# Patient Record
Sex: Female | Born: 2000
Health system: Southern US, Community
[De-identification: ages and names within clinical notes are randomized; demographics above are authoritative.]

## PROBLEM LIST (undated history)

## (undated) DIAGNOSIS — K449 Diaphragmatic hernia without obstruction or gangrene: Secondary | ICD-10-CM

## (undated) DIAGNOSIS — J45909 Unspecified asthma, uncomplicated: Secondary | ICD-10-CM

## (undated) DIAGNOSIS — K429 Umbilical hernia without obstruction or gangrene: Secondary | ICD-10-CM

## (undated) DIAGNOSIS — J939 Pneumothorax, unspecified: Secondary | ICD-10-CM

## (undated) HISTORY — PX: HERNIA REPAIR: SHX51

---
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
## 2023-09-29 ENCOUNTER — Encounter: Payer: Self-pay | Admitting: Pediatrics

## 2023-12-01 ENCOUNTER — Ambulatory Visit
Admission: EM | Admit: 2023-12-01 | Discharge: 2023-12-01 | Disposition: A | Discharge status: Home / Self Care | Payer: No Typology Code available for payment source

## 2023-12-01 DIAGNOSIS — R519 Headache, unspecified: Secondary | ICD-10-CM

## 2023-12-01 DIAGNOSIS — R42 Dizziness and giddiness: Secondary | ICD-10-CM

## 2023-12-01 DIAGNOSIS — R110 Nausea: Secondary | ICD-10-CM | POA: Diagnosis not present

## 2023-12-01 MED ORDER — MECLIZINE HCL 12.5 MG PO TABS: 12.5000 mg | ORAL_TABLET | Freq: Three times a day (TID) | ORAL | 0 refills | Status: AC | PRN

## 2023-12-01 NOTE — ED Provider Notes (Signed)
 Katherine Griffin    CSN: [id number] Arrival date & time: 12/01/23  0840      History   Chief Complaint Chief Complaint  Patient presents with   Nausea    HPI Katherine Griffin is a 23 y.o. female.  Patient presents with 87-month history of intermittent episodes of nausea, dizziness, headache.  She states the episodes come and waves when she is driving or changing position.  She describes the dizziness as the room spinning at times and her feeling off balance at times.  She has been seen by her PCP at the [facility] for these symptoms twice.  She was told that she has a vitamin D deficiency and started on vitamin D supplement.  Her symptoms have not improved.  She spoke to a nurse yesterday at the [facility] and was told to come to urgent care for evaluation of possible vertigo.  Patient denies weakness, numbness, chest pain, shortness of breath, vomiting with these episodes.  She currently has a mild headache which she rates as 3/10 but no nausea or dizziness.  Her medical history includes asthma, allergies, eczema, GERD, ADHD.  Patient was seen at [facility] ED on 09/27/2023 for chest pain and had negative workup.  LMP 11/24/2023.  The history is provided by the patient and medical records.    Past Medical History:  Diagnosis Date   Acne vulgaris 09/2016   ADHD 11/2018   Allergic rhinitis 01/2018   Asthma 08/2015   Diaphragmatic hernia newborn   Eczema 09/2016   Gastroesophageal reflux 04/2014   GI - Dr Gretta   Lactose intolerance 12/2013   GI - Dr Gretta   Pneumothorax newborn   Umbilical hernia 2008    There are no active problems to display for this patient.   Past Surgical History:  Procedure Laterality Date   DIAPHRAGMATIC HERNIA REPAIR Right 2002-08-23   UMBILICAL HERNIA REPAIR  03/2007    OB History   No obstetric history on file.      Home Medications    Prior to Admission medications   Medication Sig Start Date End Date Taking? Authorizing Provider   cholecalciferol (VITAMIN D3) 25 MCG (1000 UNIT) tablet Take 1,000 Units by mouth daily.   Yes [provider]  FLUoxetine (PROZAC) 20 MG tablet Take 20 mg by mouth daily.   Yes [provider]  meclizine  (ANTIVERT ) 12.5 MG tablet Take 1 tablet (12.5 mg total) by mouth 3 (three) times daily as needed for dizziness. 12/01/23  Yes Corlis Burnard DEL, NP  QUEtiapine (SEROQUEL) 50 MG tablet Take 50 mg by mouth at bedtime.   Yes [provider]  albuterol (PROVENTIL HFA;VENTOLIN HFA) 108 (90 BASE) MCG/ACT inhaler Inhale 2 puffs into the lungs every 4 (four) hours as needed for wheezing or shortness of breath.    [provider]  fluticasone  (FLONASE ) 50 MCG/ACT nasal spray Place 1 spray into both nostrils daily. 08/29/19 09/28/19  Qayumi, Zainab S, MD  loratadine (CLARITIN) 10 MG tablet Take 10 mg by mouth daily.    [provider]  methylphenidate  (CONCERTA ) 18 MG PO CR tablet Take 1 tablet (18 mg total) by mouth every morning. Patient not taking: Reported on 12/01/2023 08/29/19 09/28/19  Qayumi, Zainab S, MD  Multiple Vitamins-Minerals (MULTIVITAMIN PO) Take 2 tablets by mouth daily.    [provider]    Family History History reviewed. No pertinent family history.  Social History Social History   Tobacco Use   Smoking status: Never  Smokeless tobacco: Never  Substance Use Topics   Alcohol use: Never   Drug use: Never     Allergies   Lactose, Lactose intolerance (gi), and Penicillins   Review of Systems Review of Systems  Constitutional:  Negative for chills and fever.  Respiratory:  Negative for cough and shortness of breath.   Cardiovascular:  Negative for chest pain and palpitations.  Gastrointestinal:  Positive for nausea. Negative for abdominal pain and vomiting.  Neurological:  Positive for dizziness, light-headedness and headaches. Negative for syncope, facial asymmetry, speech difficulty, weakness and numbness.     Physical  Exam Triage Vital Signs ED Triage Vitals  Encounter Vitals Group     BP 12/01/23 0935 119/76     Systolic BP Percentile --      Diastolic BP Percentile --      Pulse Rate 12/01/23 0935 74     Resp 12/01/23 0935 18     Temp 12/01/23 0935 98.2 F (36.8 C)     Temp src --      SpO2 12/01/23 0935 99 %     Weight --      Height --      Head Circumference --      Peak Flow --      Pain Score 12/01/23 0938 0     Pain Loc --      Pain Education --      Exclude from Growth Chart --    No data found.  Updated Vital Signs BP 119/76   Pulse 74   Temp 98.2 F (36.8 C)   Resp 18   LMP 11/24/2023   SpO2 99%   Visual Acuity Right Eye Distance:   Left Eye Distance:   Bilateral Distance:    Right Eye Near:   Left Eye Near:    Bilateral Near:     Physical Exam Constitutional:      General: She is not in acute distress. HENT:     Right Ear: Tympanic membrane normal.     Left Ear: Tympanic membrane normal.     Nose: Nose normal.     Mouth/Throat:     Mouth: Mucous membranes are moist.     Pharynx: Oropharynx is clear.  Eyes:     Pupils: Pupils are equal, round, and reactive to light.  Cardiovascular:     Rate and Rhythm: Normal rate and regular rhythm.     Heart sounds: Normal heart sounds.  Pulmonary:     Effort: Pulmonary effort is normal. No respiratory distress.     Breath sounds: Normal breath sounds.  Abdominal:     General: Bowel sounds are normal.     Palpations: Abdomen is soft.     Tenderness: There is no abdominal tenderness. There is no guarding or rebound.  Skin:    General: Skin is warm and dry.  Neurological:     General: No focal deficit present.     Mental Status: She is alert and oriented to person, place, and time.     Cranial Nerves: No cranial nerve deficit.     Sensory: No sensory deficit.     Motor: No weakness.     Coordination: Romberg sign negative.     Gait: Gait normal.      UC Treatments / Results  Labs (all labs ordered are  listed, but only abnormal results are displayed) Labs Reviewed - No data to display  EKG   Radiology No results found.  Procedures Procedures (including critical  care time)  Medications Ordered in UC Medications - No data to display  Initial Impression / Assessment and Plan / UC Course  I have reviewed the triage vital signs and the nursing notes.  Pertinent labs & imaging results that were available during my care of the patient were reviewed by me and considered in my medical decision making (see chart for details).    Dizziness, vertigo, nausea without vomiting, headache.  Afebrile and vital signs are stable.  Exam is reassuring.  Treating today with meclizine ; precautions for drowsiness with this medication discussed.  Instructed patient to follow-up with her PCP tomorrow.  ED precautions given.  Education provided on vertigo, dizziness, nausea, headache.  Patient agrees to plan of care.  Final Clinical Impressions(s) / UC Diagnoses   Final diagnoses:  Vertigo  Dizziness  Nausea without vomiting  Nonintractable episodic headache, unspecified headache type     Discharge Instructions      Take the meclizine  as directed.  Do not drive, operate machinery, drink alcohol, or perform dangerous activities while taking this medication as it may cause drowsiness.  Follow up with your primary care provider tomorrow.  Go to the emergency department if you have worsening symptoms.        ED Prescriptions     Medication Sig Dispense Auth. Provider   meclizine  (ANTIVERT ) 12.5 MG tablet Take 1 tablet (12.5 mg total) by mouth 3 (three) times daily as needed for dizziness. 30 tablet Corlis Burnard DEL, NP      PDMP not reviewed this encounter.   Corlis Burnard DEL, NP 12/01/23 1044

## 2023-12-01 NOTE — ED Triage Notes (Addendum)
 Patient to Urgent Care with complaints of headaches/ nausea.   Symptoms started 2-3 months ago. Has been seen at the [facility] twice for the same. Told she has a vitamin D deficiency- spoke with a triage nurse yesterday who advised her to be seen at Urgent Care for possible vertigo.   Taking vitamin D supplement w/ little improvement.

## 2023-12-01 NOTE — Discharge Instructions (Addendum)
 Take the meclizine  as directed.  Do not drive, operate machinery, drink alcohol, or perform dangerous activities while taking this medication as it may cause drowsiness.  Follow up with your primary care provider tomorrow.  Go to the emergency department if you have worsening symptoms.

## 2024-01-03 ENCOUNTER — Encounter (HOSPITAL_BASED_OUTPATIENT_CLINIC_OR_DEPARTMENT_OTHER): Payer: Self-pay | Admitting: Emergency Medicine

## 2024-01-03 ENCOUNTER — Emergency Department (HOSPITAL_BASED_OUTPATIENT_CLINIC_OR_DEPARTMENT_OTHER)
Admission: EM | Admit: 2024-01-03 | Discharge: 2024-01-03 | Disposition: A | Discharge status: Home / Self Care | Payer: Non-veteran care | Attending: Emergency Medicine | Admitting: Emergency Medicine

## 2024-01-03 ENCOUNTER — Other Ambulatory Visit: Payer: Self-pay

## 2024-01-03 DIAGNOSIS — Z20822 Contact with and (suspected) exposure to covid-19: Secondary | ICD-10-CM | POA: Insufficient documentation

## 2024-01-03 DIAGNOSIS — J029 Acute pharyngitis, unspecified: Secondary | ICD-10-CM | POA: Diagnosis present

## 2024-01-03 DIAGNOSIS — J101 Influenza due to other identified influenza virus with other respiratory manifestations: Secondary | ICD-10-CM | POA: Diagnosis not present

## 2024-01-03 LAB — RESP PANEL BY RT-PCR (RSV, FLU A&B, COVID)  RVPGX2
Influenza A by PCR: POSITIVE — AB
Influenza B by PCR: NEGATIVE
Resp Syncytial Virus by PCR: NEGATIVE
SARS Coronavirus 2 by RT PCR: NEGATIVE

## 2024-01-03 MED ORDER — KETOROLAC TROMETHAMINE 15 MG/ML IJ SOLN
30.0000 mg | Freq: Once | INTRAMUSCULAR | Status: AC
  Administered 2024-01-03: 30 mg via INTRAMUSCULAR
  Filled 2024-01-03: qty 2

## 2024-01-03 MED ORDER — OXYMETAZOLINE HCL 0.05 % NA SOLN: 1.0000 | Freq: Two times a day (BID) | NASAL | 0 refills | Status: AC

## 2024-01-03 NOTE — ED Provider Notes (Signed)
  EMERGENCY DEPARTMENT AT [facility] Provider Note   CSN: [id number] Arrival date & time: 01/03/24  1333     History  Chief Complaint  Patient presents with   Influenza    Katherine Griffin is a 23 y.o. female.  Patient with no significant past medical history presents to the emergency department for evaluation of flulike symptoms over the past 6 days.  Patient originally had a sore throat, cough and fever.  This lasted for about 2 to 3 days.  She also had headaches which have improved.  She continues to have nasal congestion and she cannot breathe well through her nose.  She also has generalized bodyaches which have been persistent.  She states her appetite has been poor because she cannot smell.  She has tried over-the-counter medications without much improvement.       Home Medications Prior to Admission medications   Medication Sig Start Date End Date Taking? Authorizing Provider  oxymetazoline  (AFRIN NASAL SPRAY) 0.05 % nasal spray Place 1 spray into both nostrils 2 (two) times daily. Do not use for more than 3 consecutive days as this medication will cause rebound congestion. 01/03/24  Yes Desiderio Chew, PA-C  albuterol  (PROVENTIL  HFA;VENTOLIN  HFA) 108 (90 BASE) MCG/ACT inhaler Inhale 2 puffs into the lungs every 4 (four) hours as needed for wheezing or shortness of breath.    [provider]  cholecalciferol (VITAMIN D3) 25 MCG (1000 UNIT) tablet Take 1,000 Units by mouth daily.    [provider]  FLUoxetine (PROZAC) 20 MG tablet Take 20 mg by mouth daily.    [provider]  fluticasone  (FLONASE ) 50 MCG/ACT nasal spray Place 1 spray into both nostrils daily. 08/29/19 09/28/19  Qayumi, Zainab S, MD  loratadine (CLARITIN) 10 MG tablet Take 10 mg by mouth daily.    [provider]  meclizine  (ANTIVERT ) 12.5 MG tablet Take 1 tablet (12.5 mg total) by mouth 3 (three) times daily as needed for dizziness. 12/01/23   Corlis Burnard DEL,  NP  methylphenidate  (CONCERTA ) 18 MG PO CR tablet Take 1 tablet (18 mg total) by mouth every morning. Patient not taking: Reported on 12/01/2023 08/29/19 09/28/19  Qayumi, Zainab S, MD  Multiple Vitamins-Minerals (MULTIVITAMIN PO) Take 2 tablets by mouth daily.    [provider]  QUEtiapine (SEROQUEL) 50 MG tablet Take 50 mg by mouth at bedtime.    [provider]      Allergies    Lactose, Lactose intolerance (gi), and Penicillins    Review of Systems   Review of Systems  Physical Exam Updated Vital Signs BP 127/87   Pulse 84   Temp 99 F (37.2 C) (Oral)   Resp 18   Ht 5' 3 (1.6 m)   Wt 65.8 kg   LMP 12/24/2023 (Approximate)   SpO2 100%   BMI 25.69 kg/m  Physical Exam Vitals and nursing note reviewed.  Constitutional:      Appearance: She is well-developed.  HENT:     Head: Normocephalic and atraumatic.     Jaw: No trismus.     Right Ear: Tympanic membrane, ear canal and external ear normal.     Left Ear: Tympanic membrane, ear canal and external ear normal.     Nose: Mucosal edema and congestion present. No rhinorrhea.     Right Turbinates: Swollen.     Left Turbinates: Swollen.     Mouth/Throat:     Mouth: Mucous membranes are moist. Mucous membranes are not dry.  No oral lesions.     Pharynx: Uvula midline. No oropharyngeal exudate, posterior oropharyngeal erythema or uvula swelling.     Tonsils: No tonsillar abscesses.  Eyes:     General:        Right eye: No discharge.        Left eye: No discharge.     Conjunctiva/sclera: Conjunctivae normal.  Cardiovascular:     Rate and Rhythm: Normal rate and regular rhythm.     Heart sounds: Normal heart sounds.  Pulmonary:     Effort: Pulmonary effort is normal. No respiratory distress.     Breath sounds: Normal breath sounds. No wheezing or rales.  Abdominal:     Palpations: Abdomen is soft.     Tenderness: There is no abdominal tenderness.  Musculoskeletal:     Cervical back: Normal range of  motion and neck supple.  Lymphadenopathy:     Cervical: No cervical adenopathy.  Skin:    General: Skin is warm and dry.  Neurological:     Mental Status: She is alert.  Psychiatric:        Mood and Affect: Mood normal.     ED Results / Procedures / Treatments   Labs (all labs ordered are listed, but only abnormal results are displayed) Labs Reviewed  RESP PANEL BY RT-PCR (RSV, FLU A&B, COVID)  RVPGX2 - Abnormal; Notable for the following components:      Result Value   Influenza A by PCR POSITIVE (*)    All other components within normal limits    EKG None  Radiology No results found.  Procedures Procedures    Medications Ordered in ED Medications  ketorolac  (TORADOL ) 15 MG/ML injection 30 mg (has no administration in time range)    ED Course/ Medical Decision Making/ A&P    Patient seen and examined. History obtained directly from patient. Work-up including labs, imaging, EKG ordered in triage, if performed, were reviewed.    Labs/EKG: Independently reviewed and interpreted.  This included: Viral panel, positive for influenza  Imaging: None ordered  Medications/Fluids: Ordered: IM Toradol  30 mg x 1   Most recent vital signs reviewed and are as follows: BP 127/87   Pulse 84   Temp 99 F (37.2 C) (Oral)   Resp 18   Ht 5' 3 (1.6 m)   Wt 65.8 kg   LMP 12/24/2023 (Approximate)   SpO2 100%   BMI 25.69 kg/m   Initial impression: Influenza A  Plan: Discharge to home.   Prescriptions written for: Oxymetazoline  x 3 days, we discussed appropriate use of this medication and the risk of rebound congestion if taken for more than 3 days.  Also encouraged antihistamine such as loratadine and ibuprofen  600 mg 3-4 times a day with food for body aches.  Other home care instructions discussed: Rest, hydration. Encouraged to rest and drink plenty of fluids. Patient told to return to ED or see their primary doctor if their symptoms worsen, high fever not controlled  with tylenol , persistent vomiting, they feel they are dehydrated, or if they have any other concerns.  Patient verbalized understanding and agreed with plan.                                  Medical Decision Making Risk OTC drugs. Prescription drug management.   Patient with symptoms consistent with influenza, gradually improving but still with lingering symptoms.  Treatment focusing on most bothersome symptoms at  this time.  Vitals are stable, no fever here. No signs of dehydration, tolerating PO's. Lungs are clear. Supportive therapy indicated with return if symptoms worsen. Patient counseled.         Final Clinical Impression(s) / ED Diagnoses Final diagnoses:  Influenza A    Rx / DC Orders ED Discharge Orders          Ordered    oxymetazoline  (AFRIN NASAL SPRAY) 0.05 % nasal spray  2 times daily        01/03/24 1822              Desiderio Chew, PA-C 01/03/24 1835    Jerrol Agent, MD 01/04/24 1450

## 2024-01-03 NOTE — Discharge Instructions (Signed)
 Please read and follow all provided instructions.  Your diagnoses today include:  1. Influenza A     Tests performed today include: Flu, COVID, RSV testing: Positive for influenza Vital signs. See below for your results today.   Medications prescribed:  Oxymetazoline  - nasal spray for congestion. Do not use for more than 3 days because this medicine can cause rebound congestion.   Take any prescribed medications only as directed.  Home care instructions:  Please take medication such as loratadine daily and use ibuprofen  600 mg every 6-8 hours with food for body aches.  Follow any educational materials contained in this packet. Please continue drinking plenty of fluids. Use over-the-counter cold and flu medications as needed as directed on packaging for symptom relief. You may also use ibuprofen  or tylenol  as directed on packaging for pain or fever.   BE VERY CAREFUL not to take multiple medicines containing Tylenol  (also called acetaminophen ). Doing so can lead to an overdose which can damage your liver and cause liver failure and possibly death.   Follow-up instructions: Please follow-up with your primary care provider in the next 3 days for further evaluation of your symptoms if not improving.  Return instructions:  Please return to the Emergency Department if you experience worsening symptoms. Please return if you have a high fever greater than 101 degrees not controlled with over-the-counter medications, persistent vomiting and cannot keep down fluids, or worsening trouble breathing. Please return if you have any other emergent concerns.  Additional Information:  Your vital signs today were: BP 127/87   Pulse 84   Temp 99 F (37.2 C) (Oral)   Resp 18   Ht 5' 3 (1.6 m)   Wt 65.8 kg   LMP 12/24/2023 (Approximate)   SpO2 100%   BMI 25.69 kg/m  If your blood pressure (BP) was elevated above 135/85 this visit, please have this repeated by your doctor within one month.

## 2024-01-03 NOTE — ED Triage Notes (Signed)
Pt via POV c/o flu symptoms and sinus headache x 5 days. Runny nose, cough, sore throat, body aches, fever.

## 2024-10-26 ENCOUNTER — Ambulatory Visit
Admission: EM | Admit: 2024-10-26 | Discharge: 2024-10-26 | Disposition: A | Discharge status: Home / Self Care | Attending: Family Medicine | Admitting: Family Medicine

## 2024-10-26 DIAGNOSIS — J069 Acute upper respiratory infection, unspecified: Secondary | ICD-10-CM

## 2024-10-26 DIAGNOSIS — J4521 Mild intermittent asthma with (acute) exacerbation: Secondary | ICD-10-CM | POA: Diagnosis not present

## 2024-10-26 LAB — POC COVID19/FLU A&B COMBO
Covid Antigen, POC: NEGATIVE
Influenza A Antigen, POC: NEGATIVE
Influenza B Antigen, POC: NEGATIVE

## 2024-10-26 MED ORDER — ALBUTEROL SULFATE HFA 108 (90 BASE) MCG/ACT IN AERS: 2.0000 | INHALATION_SPRAY | RESPIRATORY_TRACT | 0 refills | Status: AC | PRN

## 2024-10-26 MED ORDER — PREDNISONE 20 MG PO TABS: 40.0000 mg | ORAL_TABLET | Freq: Every day | ORAL | 0 refills | Status: AC

## 2024-10-26 MED ORDER — AZELASTINE HCL 0.1 % NA SOLN: 1.0000 | Freq: Two times a day (BID) | NASAL | 0 refills | Status: AC

## 2024-10-26 NOTE — ED Provider Notes (Signed)
 [facility]    CSN: [id number] Arrival date & time: 10/26/24  0841      History   Chief Complaint No chief complaint on file.   HPI Katherine Griffin is a 23 y.o. female.   Presenting today with 3-day history of nasal congestion, sinus pain and pressure, body aches, loss of taste and smell, fatigue.  Denies fever, chest pain, shortness of breath, abdominal pain, vomiting, diarrhea.  So far trying Sudafed, Afrin, DayQuil with minimal relief.  History of allergies and asthma, not currently on any inhaler regimen.    Past Medical History:  Diagnosis Date   Acne vulgaris 09/2016   ADHD 11/2018   Allergic rhinitis 01/2018   Asthma 08/2015   Diaphragmatic hernia newborn   Eczema 09/2016   Gastroesophageal reflux 04/2014   GI - Dr Gretta   Lactose intolerance 12/2013   GI - Dr Gretta   Pneumothorax newborn   Umbilical hernia 2008    There are no active problems to display for this patient.   Past Surgical History:  Procedure Laterality Date   DIAPHRAGMATIC HERNIA REPAIR Right 15-Nov-2002   UMBILICAL HERNIA REPAIR  03/2007    OB History   No obstetric history on file.      Home Medications    Prior to Admission medications   Medication Sig Start Date End Date Taking? Authorizing Provider  albuterol (VENTOLIN HFA) 108 (90 Base) MCG/ACT inhaler Inhale 2 puffs into the lungs every 4 (four) hours as needed. 10/26/24  Yes Stuart Vernell Norris, PA-C  azelastine (ASTELIN) 0.1 % nasal spray Place 1 spray into both nostrils 2 (two) times daily. Use in each nostril as directed 10/26/24  Yes Stuart Vernell Norris, PA-C  predniSONE (DELTASONE) 20 MG tablet Take 2 tablets (40 mg total) by mouth daily with breakfast. 10/26/24  Yes Stuart Vernell Norris, PA-C  albuterol (PROVENTIL HFA;VENTOLIN HFA) 108 (90 BASE) MCG/ACT inhaler Inhale 2 puffs into the lungs every 4 (four) hours as needed for wheezing or shortness of breath.    [provider]  cholecalciferol  (VITAMIN D3) 25 MCG (1000 UNIT) tablet Take 1,000 Units by mouth daily.    [provider]  FLUoxetine (PROZAC) 20 MG tablet Take 20 mg by mouth daily.    [provider]  fluticasone  (FLONASE ) 50 MCG/ACT nasal spray Place 1 spray into both nostrils daily. 08/29/19 09/28/19  Qayumi, Zainab S, MD  loratadine (CLARITIN) 10 MG tablet Take 10 mg by mouth daily.    [provider]  meclizine  (ANTIVERT ) 12.5 MG tablet Take 1 tablet (12.5 mg total) by mouth 3 (three) times daily as needed for dizziness. 12/01/23   Corlis Burnard DEL, NP  methylphenidate  (CONCERTA ) 18 MG PO CR tablet Take 1 tablet (18 mg total) by mouth every morning. Patient not taking: Reported on 12/01/2023 08/29/19 09/28/19  Qayumi, Zainab S, MD  Multiple Vitamins-Minerals (MULTIVITAMIN PO) Take 2 tablets by mouth daily.    [provider]  oxymetazoline  (AFRIN NASAL SPRAY) 0.05 % nasal spray Place 1 spray into both nostrils 2 (two) times daily. Do not use for more than 3 consecutive days as this medication will cause rebound congestion. 01/03/24   Geiple, Joshua, PA-C  QUEtiapine (SEROQUEL) 50 MG tablet Take 50 mg by mouth at bedtime.    [provider]    Family History History reviewed. No pertinent family history.  Social History Social History   Tobacco Use   Smoking status: Never   Smokeless tobacco: Never  Substance Use Topics   Alcohol use: Never   Drug use: Never     Allergies   Lactose, Lactose intolerance (gi), and Penicillins   Review of Systems Review of Systems Per HPI  Physical Exam Triage Vital Signs ED Triage Vitals  Encounter Vitals Group     BP 10/26/24 0911 137/88     Girls Systolic BP Percentile --      Girls Diastolic BP Percentile --      Boys Systolic BP Percentile --      Boys Diastolic BP Percentile --      Pulse Rate 10/26/24 0911 98     Resp 10/26/24 0911 20     Temp 10/26/24 0911 98.4 F (36.9 C)     Temp src --      SpO2 10/26/24 0911 100 %      Weight --      Height --      Head Circumference --      Peak Flow --      Pain Score 10/26/24 0912 4     Pain Loc --      Pain Education --      Exclude from Growth Chart --    No data found.  Updated Vital Signs BP 137/88 (BP Location: Right Arm)   Pulse 98   Temp 98.4 F (36.9 C)   Resp 20   LMP 10/16/2024 Comment: end date  SpO2 100%   Visual Acuity Right Eye Distance:   Left Eye Distance:   Bilateral Distance:    Right Eye Near:   Left Eye Near:    Bilateral Near:     Physical Exam Vitals and nursing note reviewed.  Constitutional:      Appearance: Normal appearance.  HENT:     Head: Atraumatic.     Right Ear: Tympanic membrane and external ear normal.     Left Ear: Tympanic membrane and external ear normal.     Nose: Rhinorrhea present.     Mouth/Throat:     Mouth: Mucous membranes are moist.     Pharynx: Posterior oropharyngeal erythema present.  Eyes:     Extraocular Movements: Extraocular movements intact.     Conjunctiva/sclera: Conjunctivae normal.  Cardiovascular:     Rate and Rhythm: Normal rate and regular rhythm.     Heart sounds: Normal heart sounds.  Pulmonary:     Effort: Pulmonary effort is normal.     Breath sounds: Normal breath sounds. No wheezing.  Musculoskeletal:        General: Normal range of motion.     Cervical back: Normal range of motion and neck supple.  Skin:    General: Skin is warm and dry.  Neurological:     Mental Status: She is alert and oriented to person, place, and time.  Psychiatric:        Mood and Affect: Mood normal.        Thought Content: Thought content normal.      UC Treatments / Results  Labs (all labs ordered are listed, but only abnormal results are displayed) Labs Reviewed  POC COVID19/FLU A&B COMBO    EKG   Radiology No results found.  Procedures Procedures (including critical care time)  Medications Ordered in UC Medications - No data to display  Initial Impression / Assessment  and Plan / UC Course  I have reviewed the triage vital signs and the nursing notes.  Pertinent labs & imaging results that were available during my care  of the patient were reviewed by me and considered in my medical decision making (see chart for details).     Rapid flu and COVID-negative, vitals and exam reassuring today.  Suspect viral respiratory infection causing asthma exacerbation.  Treat with prednisone, albuterol, Astelin, supportive over-the-counter medications and home care.  Return for worsening or unresolving symptoms.  Final Clinical Impressions(s) / UC Diagnoses   Final diagnoses:  Viral URI with cough  Mild intermittent asthma with acute exacerbation   Discharge Instructions   None    ED Prescriptions     Medication Sig Dispense Auth. Provider   predniSONE (DELTASONE) 20 MG tablet Take 2 tablets (40 mg total) by mouth daily with breakfast. 10 tablet Stuart Vernell Norris, PA-C   albuterol (VENTOLIN HFA) 108 (90 Base) MCG/ACT inhaler Inhale 2 puffs into the lungs every 4 (four) hours as needed. 18 g Stuart Vernell Norris, PA-C   azelastine (ASTELIN) 0.1 % nasal spray Place 1 spray into both nostrils 2 (two) times daily. Use in each nostril as directed 30 mL Stuart Vernell Norris, PA-C      PDMP not reviewed this encounter.   Stuart Vernell Norris, [place] 10/26/24 1922

## 2024-10-26 NOTE — ED Triage Notes (Signed)
 Pt reports she has a loss of taste and smell, body aches, sinus pressure, cough x 3 days   Took sudafed, afrin, and dayquil

## 2024-11-07 ENCOUNTER — Emergency Department (HOSPITAL_COMMUNITY)
Admission: EM | Admit: 2024-11-07 | Discharge: 2024-11-07 | Disposition: A | Discharge status: Home / Self Care | Attending: Emergency Medicine | Admitting: Emergency Medicine

## 2024-11-07 ENCOUNTER — Other Ambulatory Visit: Payer: Self-pay

## 2024-11-07 ENCOUNTER — Emergency Department (HOSPITAL_COMMUNITY)

## 2024-11-07 DIAGNOSIS — M79672 Pain in left foot: Secondary | ICD-10-CM | POA: Diagnosis present

## 2024-11-07 MED ORDER — IBUPROFEN 400 MG PO TABS: 600.0000 mg | ORAL_TABLET | Freq: Once | ORAL | Status: DC

## 2024-11-07 NOTE — ED Notes (Signed)
 Pt left before getting medications and discharge paperwork.

## 2024-11-07 NOTE — ED Provider Notes (Signed)
 [place] [facility] AT [facility] Provider Note   CSN: [id number] Arrival date & time: 11/07/24  0114     Patient presents with: Foot Pain  HPI Katherine Griffin is a 23 y.o. female presenting for left foot pain.  Started a couple days ago after she stepped on a toothpick.  She was able to dislodge the toothpick but unsure if there is something left still in there.  She is endorsing pain about the pad of her right foot.  No oozing or bleeding at this time.  No notable swelling or redness.    Foot Pain       Prior to Admission medications   Medication Sig Start Date End Date Taking? Authorizing Provider  albuterol  (PROVENTIL  HFA;VENTOLIN  HFA) 108 (90 BASE) MCG/ACT inhaler Inhale 2 puffs into the lungs every 4 (four) hours as needed for wheezing or shortness of breath.    [provider]  albuterol  (VENTOLIN  HFA) 108 (90 Base) MCG/ACT inhaler Inhale 2 puffs into the lungs every 4 (four) hours as needed. 10/26/24   Stuart Vernell Norris, PA-C  azelastine  (ASTELIN ) 0.1 % nasal spray Place 1 spray into both nostrils 2 (two) times daily. Use in each nostril as directed 10/26/24   Stuart Vernell Norris, PA-C  cholecalciferol (VITAMIN D3) 25 MCG (1000 UNIT) tablet Take 1,000 Units by mouth daily.    [provider]  FLUoxetine (PROZAC) 20 MG tablet Take 20 mg by mouth daily.    [provider]  fluticasone  (FLONASE ) 50 MCG/ACT nasal spray Place 1 spray into both nostrils daily. 08/29/19 09/28/19  Qayumi, Zainab S, MD  loratadine (CLARITIN) 10 MG tablet Take 10 mg by mouth daily.    [provider]  meclizine  (ANTIVERT ) 12.5 MG tablet Take 1 tablet (12.5 mg total) by mouth 3 (three) times daily as needed for dizziness. 12/01/23   Corlis Burnard DEL, NP  methylphenidate  (CONCERTA ) 18 MG PO CR tablet Take 1 tablet (18 mg total) by mouth every morning. Patient not taking: Reported on 12/01/2023 08/29/19 09/28/19  Qayumi, Zainab S, MD  Multiple  Vitamins-Minerals (MULTIVITAMIN PO) Take 2 tablets by mouth daily.    [provider]  oxymetazoline  (AFRIN NASAL SPRAY) 0.05 % nasal spray Place 1 spray into both nostrils 2 (two) times daily. Do not use for more than 3 consecutive days as this medication will cause rebound congestion. 01/03/24   Geiple, Joshua, PA-C  predniSONE  (DELTASONE ) 20 MG tablet Take 2 tablets (40 mg total) by mouth daily with breakfast. 10/26/24   Stuart Vernell Norris, PA-C  QUEtiapine (SEROQUEL) 50 MG tablet Take 50 mg by mouth at bedtime.    [provider]    Allergies: Lactose, Lactose intolerance (gi), and Penicillins    Review of Systems See HPI  Updated Vital Signs BP 129/79 (BP Location: Left Arm)   Pulse 79   Temp 98.2 F (36.8 C)   Resp 16   Ht 5' 3 (1.6 m)   Wt 68 kg   LMP 10/16/2024 Comment: end date  SpO2 100%   BMI 26.57 kg/m   Physical Exam Cardiovascular:     Pulses:          Dorsalis pedis pulses are 2+ on the right side and 2+ on the left side.  Musculoskeletal:       Feet:  Feet:     Comments: Able to ambulate and bear weight.    (all labs ordered are listed, but only abnormal results are displayed) Labs  Reviewed - No data to display  EKG: None  Radiology: DG Foot Complete Left Result Date: 11/07/2024 EXAM: 3 VIEW(S) XRAY OF THE LEFT FOOT 11/07/2024 02:00:00 AM COMPARISON: None available. CLINICAL HISTORY: Foreign body in foot. FINDINGS: BONES AND JOINTS: No acute fracture. No malalignment. No radiopaque foreign body is seen. SOFT TISSUES: The soft tissues are unremarkable. IMPRESSION: 1. No radiopaque foreign body is seen. Electronically signed by: Pinkie Pebbles MD 11/07/2024 02:06 AM EST RP Workstation: [id number]     Procedures   Medications Ordered in the ED  ibuprofen  (ADVIL ) tablet 600 mg (has no administration in time range)                                    Medical Decision Making Amount and/or Complexity of Data  Reviewed Radiology: ordered.   23 year old well-appearing female presenting for tooth pain after stepping on a toothpick.  Exam was unremarkable.  There is a tiny punctate wound in the area where the toothpick penetrated her skin.  No suggestions of resolving foreign body or infection at this time.  X-ray was negative.  Advised supportive care with ibuprofen  and Tylenol  and if her symptoms persist to follow-up with podiatry.  Discharged.     Final diagnoses:  Left foot pain    ED Discharge Orders     None          Lang Norleen POUR, PA-C 11/07/24 0736    Simon Lavonia SAILOR, MD 11/07/24 [phone]

## 2024-11-07 NOTE — ED Notes (Signed)
 Pt called to room. No answer/no show.

## 2024-11-07 NOTE — Discharge Instructions (Signed)
 Evaluation for your left foot pain was overall reassuring.  As we discussed you can take Tylenol  and ibuprofen  and apply ice for the next few days.  It is likely that your foot will be sore for about a week.  If your symptoms worsen or you notice swelling redness or pustulant oozing or any other concerning symptom please return here otherwise you could follow-up with podiatry or PCP.

## 2024-11-07 NOTE — ED Triage Notes (Addendum)
 Pt c/o L foot pain. Pt reports stepping on toothpick causing it to get stuck in her foot. Toothpick was removed by the pt, causing it to bleed. Bleeding has since stopped. Pt concerned that part of the toothpick is still in her foot.
